# Patient Record
Sex: Male | Born: 1975 | Race: White | Hispanic: No | Marital: Married | Smoking: Current every day smoker
Health system: Southern US, Community
[De-identification: ages and names within clinical notes are randomized; demographics above are authoritative.]

## PROBLEM LIST (undated history)

## (undated) DIAGNOSIS — Z8709 Personal history of other diseases of the respiratory system: Secondary | ICD-10-CM

## (undated) DIAGNOSIS — I10 Essential (primary) hypertension: Secondary | ICD-10-CM

## (undated) DIAGNOSIS — Z720 Tobacco use: Secondary | ICD-10-CM

## (undated) DIAGNOSIS — M549 Dorsalgia, unspecified: Secondary | ICD-10-CM

## (undated) DIAGNOSIS — J40 Bronchitis, not specified as acute or chronic: Secondary | ICD-10-CM

## (undated) DIAGNOSIS — Z87442 Personal history of urinary calculi: Secondary | ICD-10-CM

## (undated) DIAGNOSIS — K219 Gastro-esophageal reflux disease without esophagitis: Secondary | ICD-10-CM

## (undated) DIAGNOSIS — R0602 Shortness of breath: Secondary | ICD-10-CM

## (undated) DIAGNOSIS — G8929 Other chronic pain: Secondary | ICD-10-CM

## (undated) HISTORY — PX: OTHER SURGICAL HISTORY: SHX169

---

## 2007-01-31 HISTORY — PX: APPENDECTOMY: SHX54

## 2012-06-04 ENCOUNTER — Other Ambulatory Visit: Payer: Self-pay | Admitting: Orthopedic Surgery

## 2012-06-10 ENCOUNTER — Encounter (HOSPITAL_COMMUNITY): Payer: Self-pay | Admitting: Pharmacy Technician

## 2012-06-13 ENCOUNTER — Encounter (HOSPITAL_COMMUNITY)
Admission: RE | Admit: 2012-06-13 | Discharge: 2012-06-13 | Disposition: A | Discharge status: Home / Self Care | Payer: Self-pay | Source: Ambulatory Visit | Attending: Orthopedic Surgery | Admitting: Orthopedic Surgery

## 2012-06-13 ENCOUNTER — Encounter (HOSPITAL_COMMUNITY): Payer: Self-pay

## 2012-06-13 ENCOUNTER — Ambulatory Visit (HOSPITAL_COMMUNITY)
Admission: RE | Admit: 2012-06-13 | Discharge: 2012-06-13 | Disposition: A | Discharge status: Home / Self Care | Payer: Self-pay | Source: Ambulatory Visit | Attending: Orthopedic Surgery | Admitting: Orthopedic Surgery

## 2012-06-13 DIAGNOSIS — Z01818 Encounter for other preprocedural examination: Secondary | ICD-10-CM | POA: Insufficient documentation

## 2012-06-13 DIAGNOSIS — Z01810 Encounter for preprocedural cardiovascular examination: Secondary | ICD-10-CM | POA: Insufficient documentation

## 2012-06-13 DIAGNOSIS — F17200 Nicotine dependence, unspecified, uncomplicated: Secondary | ICD-10-CM | POA: Insufficient documentation

## 2012-06-13 DIAGNOSIS — I10 Essential (primary) hypertension: Secondary | ICD-10-CM | POA: Insufficient documentation

## 2012-06-13 DIAGNOSIS — Z01812 Encounter for preprocedural laboratory examination: Secondary | ICD-10-CM | POA: Insufficient documentation

## 2012-06-13 HISTORY — DX: Dorsalgia, unspecified: M54.9

## 2012-06-13 HISTORY — DX: Other chronic pain: G89.29

## 2012-06-13 HISTORY — DX: Personal history of urinary calculi: Z87.442

## 2012-06-13 HISTORY — DX: Personal history of other diseases of the respiratory system: Z87.09

## 2012-06-13 HISTORY — DX: Essential (primary) hypertension: I10

## 2012-06-13 LAB — CBC WITH DIFFERENTIAL/PLATELET
Basophils Relative: 1 % (ref 0–1)
Eosinophils Absolute: 0.2 10*3/uL (ref 0.0–0.7)
HCT: 51.7 % (ref 39.0–52.0)
Hemoglobin: 18.4 g/dL — ABNORMAL HIGH (ref 13.0–17.0)
Lymphs Abs: 2.9 10*3/uL (ref 0.7–4.0)
MCH: 30.8 pg (ref 26.0–34.0)
MCHC: 35.6 g/dL (ref 30.0–36.0)
MCV: 86.6 fL (ref 78.0–100.0)
Monocytes Absolute: 0.6 10*3/uL (ref 0.1–1.0)
Monocytes Relative: 9 % (ref 3–12)
Neutrophils Relative %: 49 % (ref 43–77)
RBC: 5.97 MIL/uL — ABNORMAL HIGH (ref 4.22–5.81)

## 2012-06-13 LAB — URINALYSIS, ROUTINE W REFLEX MICROSCOPIC
Bilirubin Urine: NEGATIVE
Hgb urine dipstick: NEGATIVE
Ketones, ur: NEGATIVE mg/dL
Specific Gravity, Urine: 1.021 (ref 1.005–1.030)
pH: 5.5 (ref 5.0–8.0)

## 2012-06-13 LAB — COMPREHENSIVE METABOLIC PANEL
Albumin: 4.1 g/dL (ref 3.5–5.2)
Alkaline Phosphatase: 100 U/L (ref 39–117)
BUN: 10 mg/dL (ref 6–23)
Creatinine, Ser: 0.84 mg/dL (ref 0.50–1.35)
GFR calc Af Amer: >90 mL/min (ref >90)
Glucose, Bld: 116 mg/dL — ABNORMAL HIGH (ref 70–99)
Potassium: 3.8 mEq/L (ref 3.5–5.1)
Total Bilirubin: 0.6 mg/dL (ref 0.3–1.2)
Total Protein: 8.1 g/dL (ref 6.0–8.3)

## 2012-06-13 LAB — PROTIME-INR
INR: 0.96 (ref 0.00–1.49)
Prothrombin Time: 12.7 seconds (ref 11.6–15.2)

## 2012-06-13 LAB — TYPE AND SCREEN: Antibody Screen: NEGATIVE

## 2012-06-13 MED ORDER — POVIDONE-IODINE 7.5 % EX SOLN: Freq: Once | CUTANEOUS | Status: DC

## 2012-06-13 NOTE — Pre-Procedure Instructions (Signed)
Jose Chen  06/13/2012   Your procedure is scheduled on:  Thurs, May 22   Report to [facility] Please call Short Stay at 8 AM for arrival time  Call this number if you have problems the morning of surgery: [phone]   Remember:   Do not eat food or drink liquids after midnight.   Take these medicines the morning of surgery with A SIP OF WATER: Atenolol-Chlorthalidone(Tenoretic) and Pain Pill(if needed)   Do not wear jewelry  Do not wear lotions, powders, or colognes. You may wear deodorant.  Men may shave face and neck.  Do not bring valuables to the hospital.  Contacts, dentures or bridgework may not be worn into surgery.  Leave suitcase in the car. After surgery it may be brought to your room.  For patients admitted to the hospital, checkout time is 11:00 AM the day of  discharge.   Patients discharged the day of surgery will not be allowed to drive  home.    Special Instructions: Shower using CHG 2 nights before surgery and the night before surgery.  If you shower the day of surgery use CHG.  Use special wash - you have one bottle of CHG for all showers.  You should use approximately 1/3 of the bottle for each shower.   Please read over the following fact sheets that you were given: Pain Booklet, Coughing and Deep Breathing, Blood Transfusion Information, MRSA Information and Surgical Site Infection Prevention

## 2012-06-13 NOTE — Progress Notes (Signed)
PT DOESN'T HAVE A CARDIOLOGIST  STRESS TEST DONE 4+YRS AGO AT [facility] [place]  DENIES EVER HAVING AN ECHO OR HEART CATH  GOES TO A WALK IN CLINIC IN [facility]

## 2012-06-14 ENCOUNTER — Encounter (HOSPITAL_COMMUNITY): Payer: Self-pay | Admitting: Anesthesiology

## 2012-06-14 NOTE — Progress Notes (Signed)
This is a 37 year old male who is scheduled for a decompressive lumbar laminectomy to be performed by Dr. Estill Bamberg on Thursday 20 Jun 2012. His pre-anesthesia labs dated 13 Jun 2012 revealed some abnormally elevated results to include: RBC-5.97(4.22-5.81), HGB-18.4(13.0-17.0), AST-177(0-37), ALT-352(0-53). Apparently he is seen in a walk-in clinic in [place]. I attempted to contact this patient, I left a message but received no response, as I wanted to forward these abnormal lab results to his PCP fur further evaluation. I contacted Dr. Marshell Levan office and advised them of these abnormal lab values and faxed the lab results their office at [phone]. This patient needs a GI evaluation to determine the cause of his elevated liver transaminases. This should not interfere with his surgery.

## 2012-06-19 MED ORDER — CEFAZOLIN SODIUM-DEXTROSE 2-3 GM-% IV SOLR: 2.0000 g | INTRAVENOUS | Status: DC

## 2012-06-19 NOTE — Progress Notes (Signed)
Pt. Notified of arrival time (11:30 AM)for surgery at 1350.Pt. Voices understnding.

## 2012-06-20 ENCOUNTER — Encounter (HOSPITAL_COMMUNITY): Admission: RE | Payer: Self-pay | Source: Ambulatory Visit

## 2012-06-20 ENCOUNTER — Ambulatory Visit (HOSPITAL_COMMUNITY)
Admission: RE | Admit: 2012-06-20 | Payer: Worker's Compensation | Source: Ambulatory Visit | Admitting: Orthopedic Surgery

## 2012-06-20 SURGERY — LUMBAR LAMINECTOMY/DECOMPRESSION MICRODISCECTOMY
Anesthesia: General

## 2012-08-06 ENCOUNTER — Other Ambulatory Visit: Payer: Self-pay | Admitting: Orthopedic Surgery

## 2012-08-06 ENCOUNTER — Encounter (HOSPITAL_COMMUNITY): Payer: Self-pay | Admitting: Pharmacy Technician

## 2012-08-09 ENCOUNTER — Encounter (HOSPITAL_COMMUNITY): Payer: Self-pay | Admitting: Vascular Surgery

## 2012-08-12 NOTE — Progress Notes (Signed)
Anesthesia follow-up:  Patient is a 37 year old male with history of smoking and who is in need of a lumbar laminectomy/microdiscectomy.  Case is workman's comp.  Surgery was postponed from 06/14/12 due to abnormal LFTs (AST 177, ALT 352).  He was seen by his PCP Mauricio Po, FNP on 06/18/12.  He underwent an abdominal ultrasound on 07/02/12 that showed mild fatty infiltration of the liver, mild dilation of the common bile duct 4.4 mm.  Repeat AST/ALT were 142/293.  Hepatitis B Core Ab and Hepatitis A and C antibodies were negative. His PCP recommended further evaluation, but he has not been back for follow-up. He does have a history of daily ETOH use.  Meds include Norco, ASA, atenolol/chlorthalidone. Carla at Dr. Marshell Levan office called regarding potentially rescheduling him for 06/15/12.  I felt that because his work-up was incomplete he would need further input from his PCP.  Albin Felling spoke with [place], NP who reportedly felt he would need a HIDA scan and potentially general surgery evaluation based on results.  She would not clear him at this point because he had not been back in to see her for follow-up and work-up for elevated LFT's was still incomplete.  [facility] Short Stay Center/Anesthesiology Phone [phone] 08/12/2012 12:45 PM

## 2012-08-13 ENCOUNTER — Encounter (HOSPITAL_COMMUNITY)
Admission: RE | Admit: 2012-08-13 | Discharge: 2012-08-13 | Disposition: A | Discharge status: Home / Self Care | Payer: Self-pay | Source: Ambulatory Visit | Attending: Orthopedic Surgery | Admitting: Orthopedic Surgery

## 2012-08-15 ENCOUNTER — Ambulatory Visit (HOSPITAL_COMMUNITY): Admission: RE | Admit: 2012-08-15 | Payer: Self-pay | Source: Ambulatory Visit | Admitting: Orthopedic Surgery

## 2012-08-15 ENCOUNTER — Encounter (HOSPITAL_COMMUNITY): Admission: RE | Payer: Self-pay | Source: Ambulatory Visit

## 2012-08-15 SURGERY — LUMBAR LAMINECTOMY/DECOMPRESSION MICRODISCECTOMY
Anesthesia: General | Laterality: Right

## 2012-10-22 ENCOUNTER — Other Ambulatory Visit: Payer: Self-pay | Admitting: Orthopedic Surgery

## 2012-11-01 ENCOUNTER — Encounter (HOSPITAL_COMMUNITY): Payer: Self-pay | Admitting: Pharmacy Technician

## 2012-11-01 ENCOUNTER — Encounter (HOSPITAL_COMMUNITY): Payer: Self-pay

## 2012-11-01 ENCOUNTER — Encounter (HOSPITAL_COMMUNITY)
Admission: RE | Admit: 2012-11-01 | Discharge: 2012-11-01 | Disposition: A | Discharge status: Home / Self Care | Payer: Worker's Compensation | Source: Ambulatory Visit | Attending: Orthopedic Surgery | Admitting: Orthopedic Surgery

## 2012-11-01 ENCOUNTER — Other Ambulatory Visit (HOSPITAL_COMMUNITY): Payer: Self-pay | Admitting: *Deleted

## 2012-11-01 DIAGNOSIS — Z01818 Encounter for other preprocedural examination: Secondary | ICD-10-CM | POA: Insufficient documentation

## 2012-11-01 DIAGNOSIS — Z01812 Encounter for preprocedural laboratory examination: Secondary | ICD-10-CM | POA: Insufficient documentation

## 2012-11-01 HISTORY — DX: Gastro-esophageal reflux disease without esophagitis: K21.9

## 2012-11-01 HISTORY — DX: Shortness of breath: R06.02

## 2012-11-01 HISTORY — DX: Bronchitis, not specified as acute or chronic: J40

## 2012-11-01 HISTORY — DX: Tobacco use: Z72.0

## 2012-11-01 LAB — URINALYSIS, ROUTINE W REFLEX MICROSCOPIC
Bilirubin Urine: NEGATIVE
Hgb urine dipstick: NEGATIVE
Leukocytes, UA: NEGATIVE
Nitrite: NEGATIVE
Specific Gravity, Urine: 1.022 (ref 1.005–1.030)
Urobilinogen, UA: 1 mg/dL (ref 0.0–1.0)

## 2012-11-01 LAB — COMPREHENSIVE METABOLIC PANEL
ALT: 241 U/L — ABNORMAL HIGH (ref 0–53)
AST: 120 U/L — ABNORMAL HIGH (ref 0–37)
Albumin: 4.2 g/dL (ref 3.5–5.2)
Alkaline Phosphatase: 82 U/L (ref 39–117)
BUN: 10 mg/dL (ref 6–23)
Calcium: 9.9 mg/dL (ref 8.4–10.5)
Chloride: 100 mEq/L (ref 96–112)
Potassium: 4 mEq/L (ref 3.5–5.1)
Sodium: 137 mEq/L (ref 135–145)
Total Bilirubin: 0.5 mg/dL (ref 0.3–1.2)
Total Protein: 7.7 g/dL (ref 6.0–8.3)

## 2012-11-01 LAB — CBC WITH DIFFERENTIAL/PLATELET
Basophils Absolute: 0.1 10*3/uL (ref 0.0–0.1)
Basophils Relative: 1 % (ref 0–1)
Eosinophils Absolute: 0.2 10*3/uL (ref 0.0–0.7)
Eosinophils Relative: 3 % (ref 0–5)
HCT: 50.8 % (ref 39.0–52.0)
Hemoglobin: 17.3 g/dL — ABNORMAL HIGH (ref 13.0–17.0)
MCH: 30.3 pg (ref 26.0–34.0)
MCHC: 34.1 g/dL (ref 30.0–36.0)
Monocytes Relative: 10 % (ref 3–12)
Neutro Abs: 3.7 10*3/uL (ref 1.7–7.7)
Neutrophils Relative %: 47 % (ref 43–77)
Platelets: 165 10*3/uL (ref 150–400)

## 2012-11-01 LAB — PROTIME-INR: Prothrombin Time: 13.2 seconds (ref 11.6–15.2)

## 2012-11-01 LAB — TYPE AND SCREEN: Antibody Screen: NEGATIVE

## 2012-11-01 LAB — APTT: aPTT: 27 seconds (ref 24–37)

## 2012-11-01 NOTE — Pre-Procedure Instructions (Signed)
Jose Chen  11/01/2012   Your procedure is scheduled on:  Wednesday, November 06, 2012 at 2:00 PM.   Report to [facility] Main Entrance "A" at 11:00 AM.   Call this number if you have problems the morning of surgery: [phone]   Remember:   Do not eat food or drink liquids after midnight Tuesday, 11/05/12.   Take these medicines the morning of surgery with A SIP OF WATER: atenolol-chlorthalidone (TENORETIC), HYDROcodone-acetaminophen (NORCO) - if needed  Stop all Vitamins, Herbal Medications, Aspirin as of today, 11/01/12.   Do not wear jewelry.  Do not wear lotions, powders, or cologne. You may wear deodorant.  Do not shave 48 hours prior to surgery. Men may shave face and neck.  Do not bring valuables to the hospital.  [facility] is not responsible                  for any belongings or valuables.               Contacts, dentures or bridgework may not be worn into surgery.  Leave suitcase in the car. After surgery it may be brought to your room.  For patients admitted to the hospital, discharge time is determined by your                treatment team.                 Special Instructions: Shower using CHG 2 nights before surgery and the night before surgery.  If you shower the day of surgery use CHG.  Use special wash - you have one bottle of CHG for all showers.  You should use approximately 1/3 of the bottle for each shower.   Please read over the following fact sheets that you were given: Pain Booklet, Coughing and Deep Breathing, MRSA Information and Surgical Site Infection Prevention

## 2012-11-01 NOTE — Progress Notes (Signed)
Primary physician - dr. Mauricio Po Does not have a cardiologist No cardiac testing. ekg in may in epic

## 2012-11-04 ENCOUNTER — Encounter (HOSPITAL_COMMUNITY): Payer: Self-pay

## 2012-11-04 NOTE — Progress Notes (Signed)
Anesthesia Chart Review:  Patient is a 37 year old male scheduled for L4-5 decompression on 11/06/12 by Dr. Yevette Edwards.  He was previously scheduled in May and then July 2014, but was postponed until he could be further evaluated by his PCP Mauricio Po, NP at [facility] for elevated LFTs (AST 177/ALT 352 in 05/2012).  Since then he was re-evaluated by Mauricio Po, NP and cleared for surgery following basic work-up for elevated liver enzymes.  She felt further GI work-up could be done post-operatively.  She did want the surgeon to consider post-operative hospitalist consult.  (See office note from 10/04/12 and clearance note dated 10/15/12.)  Other history includes smoking, obesity, HTN, nephrolithiasis, GERD, appendectomy, daily ETOH use (6 beers).  EKG on 06/13/13 showed NSR.  CXR on 06/13/13 showed no active disease.  He underwent an abdominal ultrasound on 07/02/12 that showed mild fatty infiltration of the liver, mild dilation of the common bile duct 4.4 mm.  Per PCP notes, he had a normal abdominal CT scan.  Preoperative labs noted.  AST 120, ALT 241 (stable, slightly decreased from 06/13/12).  PLT count and coags WNL. Hepatitis B Core Ab and Hepatitis A and C antibodies were negative on 06/18/12 (PCP).  I reviewed above with anesthesiologist Dr. Michelle Piper.  LFT's remain stable.  Other labs unremarkable, and recent infectious hepatitis panel was negative.  If no acute changes then anticipate that he can proceed as planned with out-patient GI follow-up as arranged by his PCP.  [facility] Short Stay Center/Anesthesiology Phone [phone] 11/04/2012 3:54 PM

## 2012-11-05 MED ORDER — CEFAZOLIN SODIUM-DEXTROSE 2-3 GM-% IV SOLR
2.0000 g | INTRAVENOUS | Status: AC
  Administered 2012-11-06: 2 g via INTRAVENOUS
  Filled 2012-11-05: qty 50

## 2012-11-05 MED ORDER — POVIDONE-IODINE 7.5 % EX SOLN
Freq: Once | CUTANEOUS | Status: DC
  Filled 2012-11-05: qty 118

## 2012-11-06 ENCOUNTER — Encounter (HOSPITAL_COMMUNITY): Payer: Worker's Compensation | Admitting: Vascular Surgery

## 2012-11-06 ENCOUNTER — Encounter (HOSPITAL_COMMUNITY)
Admission: RE | Disposition: A | Discharge status: Home / Self Care | Payer: Self-pay | Source: Ambulatory Visit | Attending: Orthopedic Surgery

## 2012-11-06 ENCOUNTER — Ambulatory Visit (HOSPITAL_COMMUNITY): Payer: Worker's Compensation | Admitting: Certified Registered"

## 2012-11-06 ENCOUNTER — Ambulatory Visit (HOSPITAL_COMMUNITY): Payer: Worker's Compensation

## 2012-11-06 ENCOUNTER — Ambulatory Visit (HOSPITAL_COMMUNITY)
Admission: RE | Admit: 2012-11-06 | Discharge: 2012-11-06 | Disposition: A | Discharge status: Home / Self Care | Payer: Worker's Compensation | Source: Ambulatory Visit | Attending: Orthopedic Surgery | Admitting: Orthopedic Surgery

## 2012-11-06 ENCOUNTER — Encounter (HOSPITAL_COMMUNITY): Payer: Self-pay | Admitting: Certified Registered"

## 2012-11-06 DIAGNOSIS — F17200 Nicotine dependence, unspecified, uncomplicated: Secondary | ICD-10-CM | POA: Insufficient documentation

## 2012-11-06 DIAGNOSIS — K219 Gastro-esophageal reflux disease without esophagitis: Secondary | ICD-10-CM | POA: Insufficient documentation

## 2012-11-06 DIAGNOSIS — I10 Essential (primary) hypertension: Secondary | ICD-10-CM | POA: Insufficient documentation

## 2012-11-06 DIAGNOSIS — M549 Dorsalgia, unspecified: Secondary | ICD-10-CM | POA: Insufficient documentation

## 2012-11-06 DIAGNOSIS — M5126 Other intervertebral disc displacement, lumbar region: Secondary | ICD-10-CM | POA: Insufficient documentation

## 2012-11-06 DIAGNOSIS — N200 Calculus of kidney: Secondary | ICD-10-CM | POA: Insufficient documentation

## 2012-11-06 DIAGNOSIS — G8929 Other chronic pain: Secondary | ICD-10-CM | POA: Insufficient documentation

## 2012-11-06 HISTORY — PX: LUMBAR LAMINECTOMY/DECOMPRESSION MICRODISCECTOMY: SHX5026

## 2012-11-06 SURGERY — LUMBAR LAMINECTOMY/DECOMPRESSION MICRODISCECTOMY
Anesthesia: General | Site: Spine Lumbar | Wound class: Clean

## 2012-11-06 MED ORDER — THROMBIN 20000 UNITS EX SOLR
CUTANEOUS | Status: DC | PRN
  Administered 2012-11-06: 14:00:00 via TOPICAL

## 2012-11-06 MED ORDER — MIDAZOLAM HCL 5 MG/5ML IJ SOLN
INTRAMUSCULAR | Status: DC | PRN
  Administered 2012-11-06: 2 mg via INTRAVENOUS

## 2012-11-06 MED ORDER — BUPIVACAINE-EPINEPHRINE 0.25% -1:200000 IJ SOLN
INTRAMUSCULAR | Status: DC | PRN
  Administered 2012-11-06: 7 mL

## 2012-11-06 MED ORDER — METHYLPREDNISOLONE ACETATE 40 MG/ML IJ SUSP
INTRAMUSCULAR | Status: AC
  Filled 2012-11-06: qty 1

## 2012-11-06 MED ORDER — EPHEDRINE SULFATE 50 MG/ML IJ SOLN
INTRAMUSCULAR | Status: DC | PRN
  Administered 2012-11-06 (×5): 10 mg via INTRAVENOUS

## 2012-11-06 MED ORDER — DIPHENHYDRAMINE HCL 50 MG/ML IJ SOLN
INTRAMUSCULAR | Status: AC
  Filled 2012-11-06: qty 1

## 2012-11-06 MED ORDER — ONDANSETRON HCL 4 MG/2ML IJ SOLN: 4.0000 mg | Freq: Once | INTRAMUSCULAR | Status: DC | PRN

## 2012-11-06 MED ORDER — PROPOFOL 10 MG/ML IV BOLUS
INTRAVENOUS | Status: DC | PRN
  Administered 2012-11-06: 350 mg via INTRAVENOUS

## 2012-11-06 MED ORDER — HEMOSTATIC AGENTS (NO CHARGE) OPTIME
TOPICAL | Status: DC | PRN
  Administered 2012-11-06: 1 via TOPICAL

## 2012-11-06 MED ORDER — DIPHENHYDRAMINE HCL 50 MG/ML IJ SOLN
12.5000 mg | Freq: Once | INTRAMUSCULAR | Status: AC
  Administered 2012-11-06: 12.5 mg via INTRAVENOUS

## 2012-11-06 MED ORDER — ONDANSETRON HCL 4 MG/2ML IJ SOLN
INTRAMUSCULAR | Status: DC | PRN
  Administered 2012-11-06: 4 mg via INTRAMUSCULAR

## 2012-11-06 MED ORDER — METHYLPREDNISOLONE ACETATE 40 MG/ML IJ SUSP
INTRAMUSCULAR | Status: DC | PRN
  Administered 2012-11-06: 40 mg via INTRA_ARTICULAR

## 2012-11-06 MED ORDER — METHYLENE BLUE 1 % INJ SOLN
INTRAMUSCULAR | Status: DC | PRN
  Administered 2012-11-06: .25 mL

## 2012-11-06 MED ORDER — ROCURONIUM BROMIDE 100 MG/10ML IV SOLN
INTRAVENOUS | Status: DC | PRN
  Administered 2012-11-06: 50 mg via INTRAVENOUS

## 2012-11-06 MED ORDER — HYDROMORPHONE HCL PF 1 MG/ML IJ SOLN
INTRAMUSCULAR | Status: AC
  Filled 2012-11-06: qty 1

## 2012-11-06 MED ORDER — LIDOCAINE HCL (CARDIAC) 20 MG/ML IV SOLN
INTRAVENOUS | Status: DC | PRN
  Administered 2012-11-06 (×2): 100 mg via INTRAVENOUS

## 2012-11-06 MED ORDER — 0.9 % SODIUM CHLORIDE (POUR BTL) OPTIME
TOPICAL | Status: DC | PRN
  Administered 2012-11-06: 1000 mL

## 2012-11-06 MED ORDER — LACTATED RINGERS IV SOLN
INTRAVENOUS | Status: DC
  Administered 2012-11-06 (×2): via INTRAVENOUS

## 2012-11-06 MED ORDER — THROMBIN 20000 UNITS EX SOLR
CUTANEOUS | Status: AC
  Filled 2012-11-06: qty 20000

## 2012-11-06 MED ORDER — INDIGOTINDISULFONATE SODIUM 8 MG/ML IJ SOLN: INTRAMUSCULAR | Status: DC | PRN

## 2012-11-06 MED ORDER — HYDROMORPHONE HCL PF 1 MG/ML IJ SOLN
0.2500 mg | INTRAMUSCULAR | Status: DC | PRN
  Administered 2012-11-06 (×2): 0.5 mg via INTRAVENOUS

## 2012-11-06 MED ORDER — SUFENTANIL CITRATE 50 MCG/ML IV SOLN
INTRAVENOUS | Status: DC | PRN
  Administered 2012-11-06 (×2): 5 ug via INTRAVENOUS
  Administered 2012-11-06: 30 ug via INTRAVENOUS

## 2012-11-06 SURGICAL SUPPLY — 66 items
BENZOIN TINCTURE PRP APPL 2/3 (GAUZE/BANDAGES/DRESSINGS) IMPLANT
BUR ROUND PRECISION 4.0 (BURR) ×4 IMPLANT
CANISTER SUCTION 2500CC (MISCELLANEOUS) ×2 IMPLANT
CARTRIDGE OIL MAESTRO DRILL (MISCELLANEOUS) ×1 IMPLANT
CLOTH BEACON ORANGE TIMEOUT ST (SAFETY) ×2 IMPLANT
CORDS BIPOLAR (ELECTRODE) ×2 IMPLANT
COVER SURGICAL LIGHT HANDLE (MISCELLANEOUS) ×2 IMPLANT
DIFFUSER DRILL AIR PNEUMATIC (MISCELLANEOUS) ×2 IMPLANT
DRAIN CHANNEL 15F RND FF W/TCR (WOUND CARE) IMPLANT
DRAPE POUCH INSTRU U-SHP 10X18 (DRAPES) ×4 IMPLANT
DRAPE SURG 17X23 STRL (DRAPES) ×8 IMPLANT
DURAPREP 26ML APPLICATOR (WOUND CARE) ×2 IMPLANT
ELECT BLADE 4.0 EZ CLEAN MEGAD (MISCELLANEOUS)
ELECT CAUTERY BLADE 6.4 (BLADE) ×2 IMPLANT
ELECT REM PT RETURN 9FT ADLT (ELECTROSURGICAL) ×2
ELECTRODE BLDE 4.0 EZ CLN MEGD (MISCELLANEOUS) IMPLANT
ELECTRODE REM PT RTRN 9FT ADLT (ELECTROSURGICAL) ×1 IMPLANT
EVACUATOR SILICONE 100CC (DRAIN) IMPLANT
FILTER STRAW FLUID ASPIR (MISCELLANEOUS) ×2 IMPLANT
GAUZE SPONGE 4X4 16PLY XRAY LF (GAUZE/BANDAGES/DRESSINGS) ×4 IMPLANT
GLOVE BIO SURGEON STRL SZ7 (GLOVE) ×2 IMPLANT
GLOVE BIO SURGEON STRL SZ8 (GLOVE) ×2 IMPLANT
GLOVE BIOGEL PI IND STRL 7.0 (GLOVE) ×1 IMPLANT
GLOVE BIOGEL PI IND STRL 8 (GLOVE) ×1 IMPLANT
GLOVE BIOGEL PI INDICATOR 7.0 (GLOVE) ×1
GLOVE BIOGEL PI INDICATOR 8 (GLOVE) ×1
GOWN STRL NON-REIN LRG LVL3 (GOWN DISPOSABLE) ×2 IMPLANT
GOWN STRL REIN XL XLG (GOWN DISPOSABLE) ×4 IMPLANT
IV CATH 14GX2 1/4 (CATHETERS) ×2 IMPLANT
KIT BASIN OR (CUSTOM PROCEDURE TRAY) ×2 IMPLANT
KIT POSITION SURG JACKSON T1 (MISCELLANEOUS) IMPLANT
KIT ROOM TURNOVER OR (KITS) ×2 IMPLANT
NEEDLE 18GX1X1/2 (RX/OR ONLY) (NEEDLE) ×2 IMPLANT
NEEDLE 22X1 1/2 (OR ONLY) (NEEDLE) ×2 IMPLANT
NEEDLE HYPO 25GX1X1/2 BEV (NEEDLE) ×2 IMPLANT
NEEDLE SPNL 18GX3.5 QUINCKE PK (NEEDLE) ×4 IMPLANT
NS IRRIG 1000ML POUR BTL (IV SOLUTION) ×2 IMPLANT
OIL CARTRIDGE MAESTRO DRILL (MISCELLANEOUS) ×2
PACK LAMINECTOMY ORTHO (CUSTOM PROCEDURE TRAY) ×2 IMPLANT
PACK UNIVERSAL I (CUSTOM PROCEDURE TRAY) ×2 IMPLANT
PAD ARMBOARD 7.5X6 YLW CONV (MISCELLANEOUS) ×4 IMPLANT
PATTIES SURGICAL .5 X.5 (GAUZE/BANDAGES/DRESSINGS) IMPLANT
PATTIES SURGICAL .5 X1 (DISPOSABLE) ×2 IMPLANT
SPONGE GAUZE 4X4 12PLY (GAUZE/BANDAGES/DRESSINGS) ×2 IMPLANT
SPONGE INTESTINAL PEANUT (DISPOSABLE) ×2 IMPLANT
SPONGE SURGIFOAM ABS GEL 100 (HEMOSTASIS) ×2 IMPLANT
STRIP CLOSURE SKIN 1/2X4 (GAUZE/BANDAGES/DRESSINGS) IMPLANT
SURGIFLO TRUKIT (HEMOSTASIS) IMPLANT
SUT MNCRL AB 4-0 PS2 18 (SUTURE) ×2 IMPLANT
SUT VIC AB 0 CT1 18XCR BRD 8 (SUTURE) ×1 IMPLANT
SUT VIC AB 0 CT1 27 (SUTURE)
SUT VIC AB 0 CT1 27XBRD ANBCTR (SUTURE) IMPLANT
SUT VIC AB 0 CT1 8-18 (SUTURE) ×1
SUT VIC AB 1 CT1 18XCR BRD 8 (SUTURE) ×1 IMPLANT
SUT VIC AB 1 CT1 8-18 (SUTURE) ×1
SUT VIC AB 2-0 CT2 18 VCP726D (SUTURE) ×2 IMPLANT
SYR 20CC LL (SYRINGE) IMPLANT
SYR BULB IRRIGATION 50ML (SYRINGE) ×2 IMPLANT
SYR CONTROL 10ML LL (SYRINGE) ×4 IMPLANT
SYR TB 1ML 26GX3/8 SAFETY (SYRINGE) ×4 IMPLANT
SYR TB 1ML LUER SLIP (SYRINGE) ×4 IMPLANT
TAPE CLOTH SURG 4X10 WHT LF (GAUZE/BANDAGES/DRESSINGS) ×2 IMPLANT
TOWEL OR 17X24 6PK STRL BLUE (TOWEL DISPOSABLE) ×2 IMPLANT
TOWEL OR 17X26 10 PK STRL BLUE (TOWEL DISPOSABLE) ×2 IMPLANT
WATER STERILE IRR 1000ML POUR (IV SOLUTION) ×2 IMPLANT
YANKAUER SUCT BULB TIP NO VENT (SUCTIONS) ×2 IMPLANT

## 2012-11-06 NOTE — Anesthesia Preprocedure Evaluation (Addendum)
Anesthesia Evaluation  Patient identified by MRN, date of birth, ID band Patient awake    Reviewed: Allergy & Precautions, H&P , NPO status , Patient's Chart, lab work & pertinent test results  Airway Mallampati: I TM Distance: >3 FB Neck ROM: full    Dental   Pulmonary Current Smoker,          Cardiovascular hypertension, Rhythm:regular Rate:Normal     Neuro/Psych    GI/Hepatic GERD-  ,  Endo/Other    Renal/GU      Musculoskeletal   Abdominal   Peds  Hematology   Anesthesia Other Findings   Reproductive/Obstetrics                          Anesthesia Physical Anesthesia Plan  ASA: I  Anesthesia Plan: General   Post-op Pain Management:    Induction: Intravenous  Airway Management Planned: Oral ETT  Additional Equipment:   Intra-op Plan:   Post-operative Plan: Extubation in OR  Informed Consent: I have reviewed the patients History and Physical, chart, labs and discussed the procedure including the risks, benefits and alternatives for the proposed anesthesia with the patient or authorized representative who has indicated his/her understanding and acceptance.     Plan Discussed with: CRNA, Anesthesiologist and Surgeon  Anesthesia Plan Comments:         Anesthesia Quick Evaluation

## 2012-11-06 NOTE — Preoperative (Signed)
Beta Blockers   Reason not to administer Beta Blockers:Atenolol taken at 0745 hrs on 11/06/12

## 2012-11-06 NOTE — Transfer of Care (Signed)
Immediate Anesthesia Transfer of Care Note  Patient: Jose Chen  Procedure(s) Performed: Procedure(s) with comments: LUMBAR LAMINECTOMY/DECOMPRESSION MICRODISCECTOMY (N/A) - Lumbar 4-5 decompression  Patient Location: PACU  Anesthesia Type:General  Level of Consciousness: awake, alert  and oriented  Airway & Oxygen Therapy: Patient Spontanous Breathing and Patient connected to nasal cannula oxygen  Post-op Assessment: Report given to PACU RN, Post -op Vital signs reviewed and stable and Patient moving all extremities  Post vital signs: Reviewed and stable  Complications: No apparent anesthesia complications

## 2012-11-06 NOTE — Anesthesia Procedure Notes (Signed)
Procedure Name: Intubation Date/Time: 11/06/2012 1:03 PM Performed by: Charm Barges, Teneka Malmberg R Pre-anesthesia Checklist: Patient identified, Emergency Drugs available, Suction available, Patient being monitored and Timeout performed Patient Re-evaluated:Patient Re-evaluated prior to inductionOxygen Delivery Method: Circle system utilized Preoxygenation: Pre-oxygenation with 100% oxygen Intubation Type: IV induction Ventilation: Mask ventilation with difficulty and Oral airway inserted - appropriate to patient size Laryngoscope Size: Mac and 4 Grade View: Grade II Tube type: Oral Tube size: 8.0 mm Number of attempts: 2 (1st attempt trachea sprayed with 2% Lidocaine) Airway Equipment and Method: Stylet Secured at: 22 cm Tube secured with: Tape Dental Injury: Teeth and Oropharynx as per pre-operative assessment

## 2012-11-06 NOTE — H&P (Signed)
PREOPERATIVE H&P  Chief Complaint: Ongoing right leg pain  HPI: Jose Chen is a 37 y.o. male who presents with ongoing right leg pain. Patient has failed multiple forms of conservative care and continues to have pain. MRI = HNP L4/5.   Past Medical History  Diagnosis Date  . Hypertension     TAKES TENORETIC DAILY  . History of bronchitis     > 66YRS AGO  . Chronic back pain   . History of kidney stones     STILL THERE BUT NOT HAVING ANY TROUBLE FROM THIS  . Shortness of breath     smoking exacerbates  . Bronchitis due to tobacco use   . GERD (gastroesophageal reflux disease)    Past Surgical History  Procedure Laterality Date  . Cyst removed      BUT PT WISHES NOT TO SAY WHERE REMOVED FROM   . Appendectomy  2009   History   Social History  . Marital Status: Single    Spouse Name: N/A    Number of Children: N/A  . Years of Education: N/A   Social History Main Topics  . Smoking status: Current Every Day Smoker -- 1.00 packs/day for 21 years  . Smokeless tobacco: Not on file  . Alcohol Use: 3.6 oz/week    6 Cans of beer per week     Comment: DAILY BEER(6)  . Drug Use: No  . Sexual Activity: Not Currently   Other Topics Concern  . Not on file   Social History Narrative  . No narrative on file   No family history on file. No Known Allergies Prior to Admission medications   Medication Sig Start Date End Date Taking? Authorizing Provider  atenolol-chlorthalidone (TENORETIC) 100-25 MG per tablet Take 1 tablet by mouth daily.   Yes Historical Provider, MD  gabapentin (NEURONTIN) 300 MG capsule Take 300 mg by mouth 3 (three) times daily as needed (pain).    Yes Historical Provider, MD  HYDROcodone-acetaminophen (NORCO) 10-325 MG per tablet Take 1 tablet by mouth 4 (four) times daily as needed for pain.    Yes Historical Provider, MD     All other systems have been reviewed and were otherwise negative with the exception of those mentioned in the HPI and as  above.  Physical Exam: There were no vitals filed for this visit.  General: Alert, no acute distress Cardiovascular: No pedal edema Respiratory: No cyanosis, no use of accessory musculature Skin: No lesions in the area of chief complaint Neurologic: Sensation intact distally Psychiatric: Patient is competent for consent with normal mood and affect Lymphatic: No axillary or cervical lymphadenopathy  MUSCULOSKELETAL: + SLR ON RIGHT  Assessment/Plan: Right leg pain Plan for Procedure(s): LUMBAR LAMINECTOMY L4/5   Emilee Hero, MD 11/06/2012 8:14 AM

## 2012-11-07 NOTE — Progress Notes (Signed)
Saw blood on the bed in preop

## 2012-11-07 NOTE — Op Note (Signed)
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  PROCEDURE:  L4-5 decompression, bilateral partial facetectomy, and right- sided foraminotomy with removal of large extruded and adherent foraminal and extra foraminal right-sided L4-5 disk herniation.  SURGEON:  Estill Bamberg, MD  ASSISTANT:  Jason Coop, [facility]  ANESTHESIA:  General endotracheal anesthesia.  COMPLICATIONS:  None.  DISPOSITION:  Stable.  ESTIMATED BLOOD LOSS:  Minimal.  INDICATIONS FOR PROCEDURE:  Briefly, Jose Chen is a very pleasant 37- year-old male who did initially present to me on May 27, 2012, with severe pain in the right leg.  The patient is status post a work injury that did occur on February 26, 2012, when he was working at a [employer].  The patient went on to have severe pain in the right leg and in the distribution of the L4 nerve on the right side.  I did review a MRI, which was clearly notable for a right-sided L4-5 disk herniation spanning the lateral recess on the right, in addition to the foramen and extraforaminal area.  We did go forward with multiple old conservative treatment measures.  The patient, however, did continue to have ongoing pain.  We therefore did discuss proceeding with a decompressive procedure to remove the herniated disk fragment.  Of note, the  patient did have a workup of his abdomen, as per his nurse practitioner, Mauricio Po.  It was ultimately felt the patient would be safe to proceed with surgery.  In addition, workup is still pending as of this morning, per the patient.  Also noted, I did explain to the patient that the disk fragment was located in the foraminal and extraforaminal area.  I therefore going to let him know that this tend to be more meticulous surgery, as it did span the lateral recess, the foraminal region and did span into the extraforaminal region as well.  The amount of bone that was required to remove the herniated fragment may result in instability in addition other issues.  I did explain this to him in detail preoperatively.  He did also understand the risk of lack of resolution of his pain.  In additional, the disk fragments were herniating into the extraforaminal region, which could potentially be an accessible.  If this were to occur, he did understand that this may result in the need for additional procedures.  The patient did fully understand this and wish to proceed.  OPERATIVE DETAILS:  On November 06, 2012, the patient brought to surgery and general endotracheal anesthesia was administered.  The patient was placed prone on a well-padded flat Jackson bed with a Wilson frame. Antibiotics were given and  a time-out procedure was performed.  I then made a midline incision overlying the L4-5 interspace.  I did use a high- speed bur to preform in order to remove the medial and superior aspect of the L4 lamina.  I then performed a partial facetectomy bilaterally. The spinous process of L4 was removed.  This did allow me the ability to gain access to the mixture foraminal area on the right side.  It was readily obvious that there was a significant and a large protruding L4-5 disk fragment and located the extraforaminal area.  I was able to locate the exiting L4 nerve, it was obvious that the nerve was  under substantial pressure from the herniated fragment.  I was able to develop a plane between the herniated fragment and the exiting L4 nerve.  I then proceeded with removing multiple fragments of disk located in the foraminal and extraforaminal region.  Of note, there were multiple fragments involved.  There were multiple fragments that were also very much adherent to the L4 vertebral body.  I did remove all fragments that was accessible.  I then took a Public house manager and was able to liberally sweep out along the course of the L4 nerve.  I did note excellent decompression of the L4 nerve in the region and I was able to palpate.  I did feel that adequate decompression was performed.  I then copiously irrigated the wound.  I then controlled epidural bleeding using Surgiflo in addition to bipolar electrocautery.  I then introduced 40 mg of Depo-Medrol about the epidural space.  I then closed the fascia using #1 Vicryl.  The subcutaneous layer was closed using 2-0 Vicryl and the skin was closed using 3-0 Monocryl.  Benzoin and Steri-Strips were applied followed by sterile dressing.  All instrument counts were correct at the termination of the procedure.  Of note, Jason Coop was my assistant throughout the entirety of the procedure, and did aid in essential retraction and suctioning needed throughout the entirety of the surgery.     Estill Bamberg, MD     MD/MEDQ  D:  11/06/2012  T:  11/07/2012  Job:  [id number]

## 2012-11-07 NOTE — Progress Notes (Signed)
Also there were previous patients labels on the bed.

## 2012-11-07 NOTE — Anesthesia Postprocedure Evaluation (Signed)
Anesthesia Post Note  Patient: Jose Chen  Procedure(s) Performed: Procedure(s) (LRB): LUMBAR LAMINECTOMY/DECOMPRESSION MICRODISCECTOMY (N/A)  Anesthesia type: General  Patient location: PACU  Post pain: Pain level controlled  Post assessment: Patient's Cardiovascular Status Stable  Last Vitals:  Filed Vitals:   11/06/12 1730  BP:   Pulse:   Temp: 36.7 C  Resp:     Post vital signs: Reviewed and stable  Level of consciousness: alert  Complications: No apparent anesthesia complications

## 2012-11-08 ENCOUNTER — Encounter (HOSPITAL_COMMUNITY): Payer: Self-pay | Admitting: Orthopedic Surgery

## 2014-03-31 DEATH — deceased

## 2014-06-06 IMAGING — CR DG CHEST 2V
2 series · 2 of 2 positions shown · non-contrast
Comparison: 09/19/2009

CLINICAL DATA: Preop for lumbar decompression

CHEST - 2 VIEW

[view not recorded (1 of 2)]
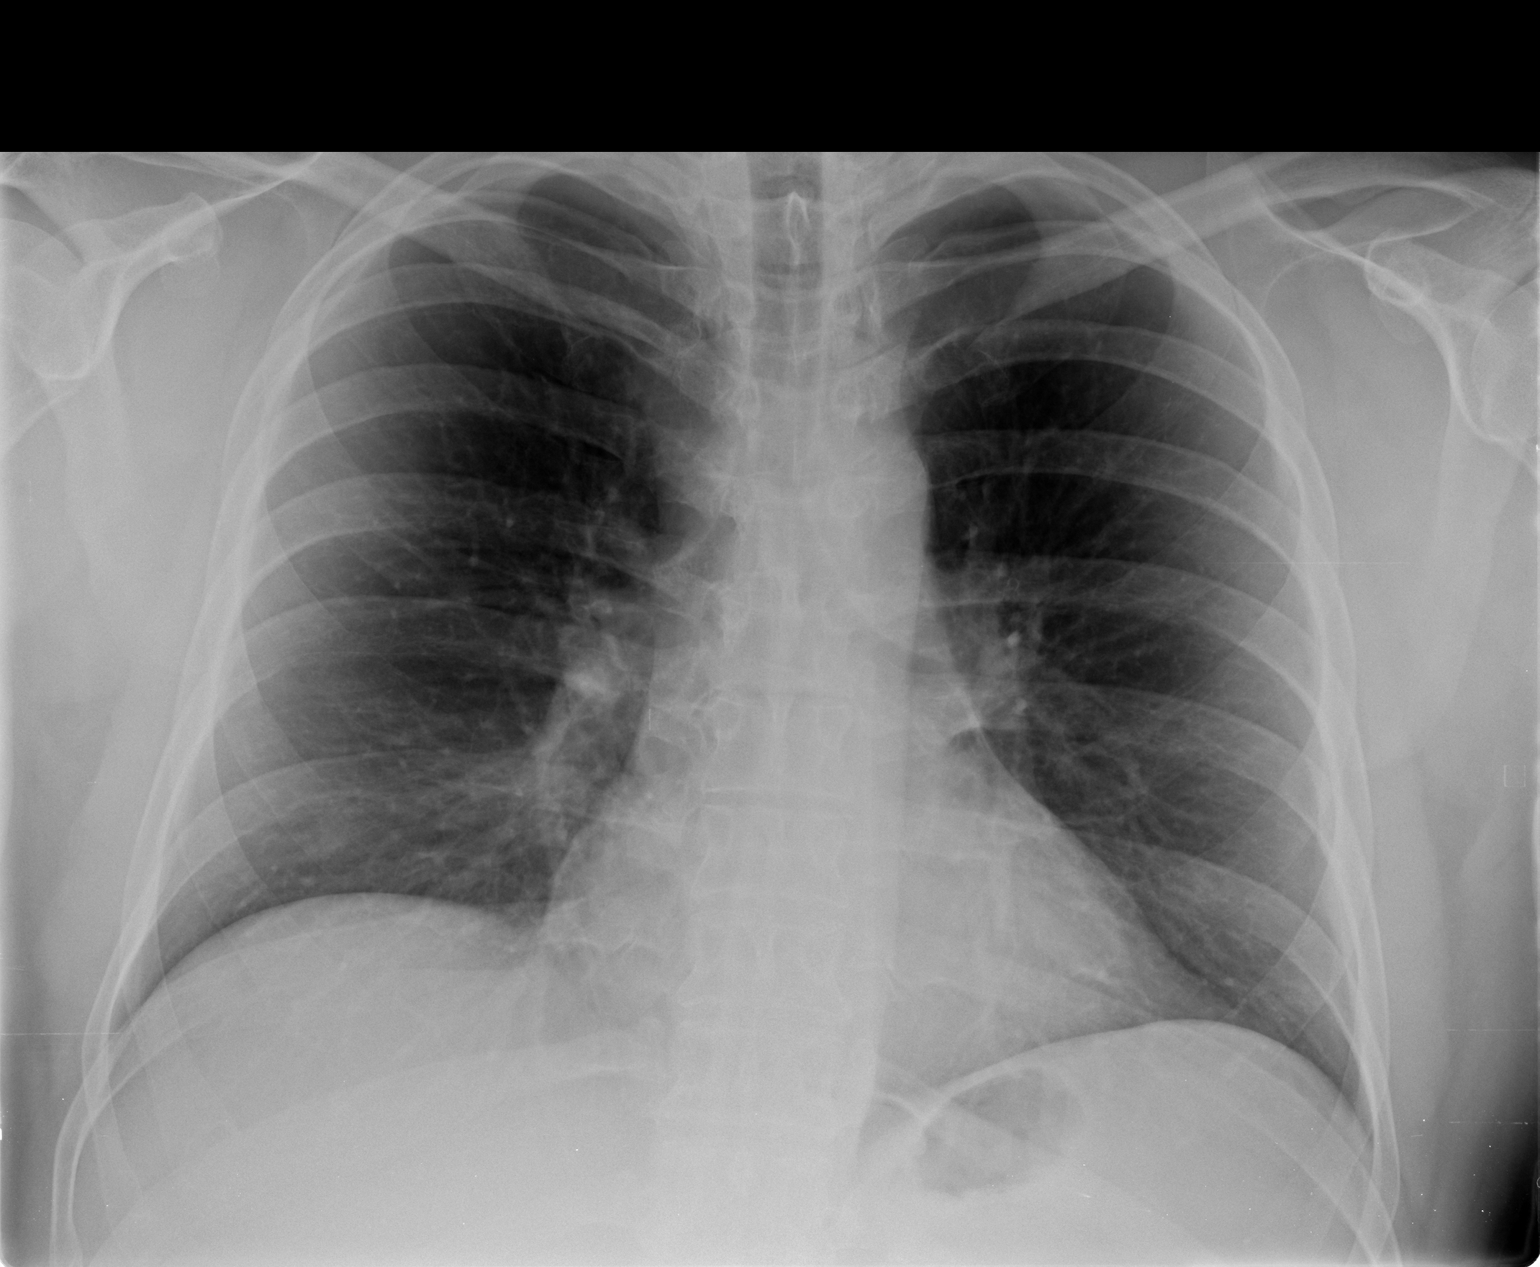

[view not recorded (2 of 2)]
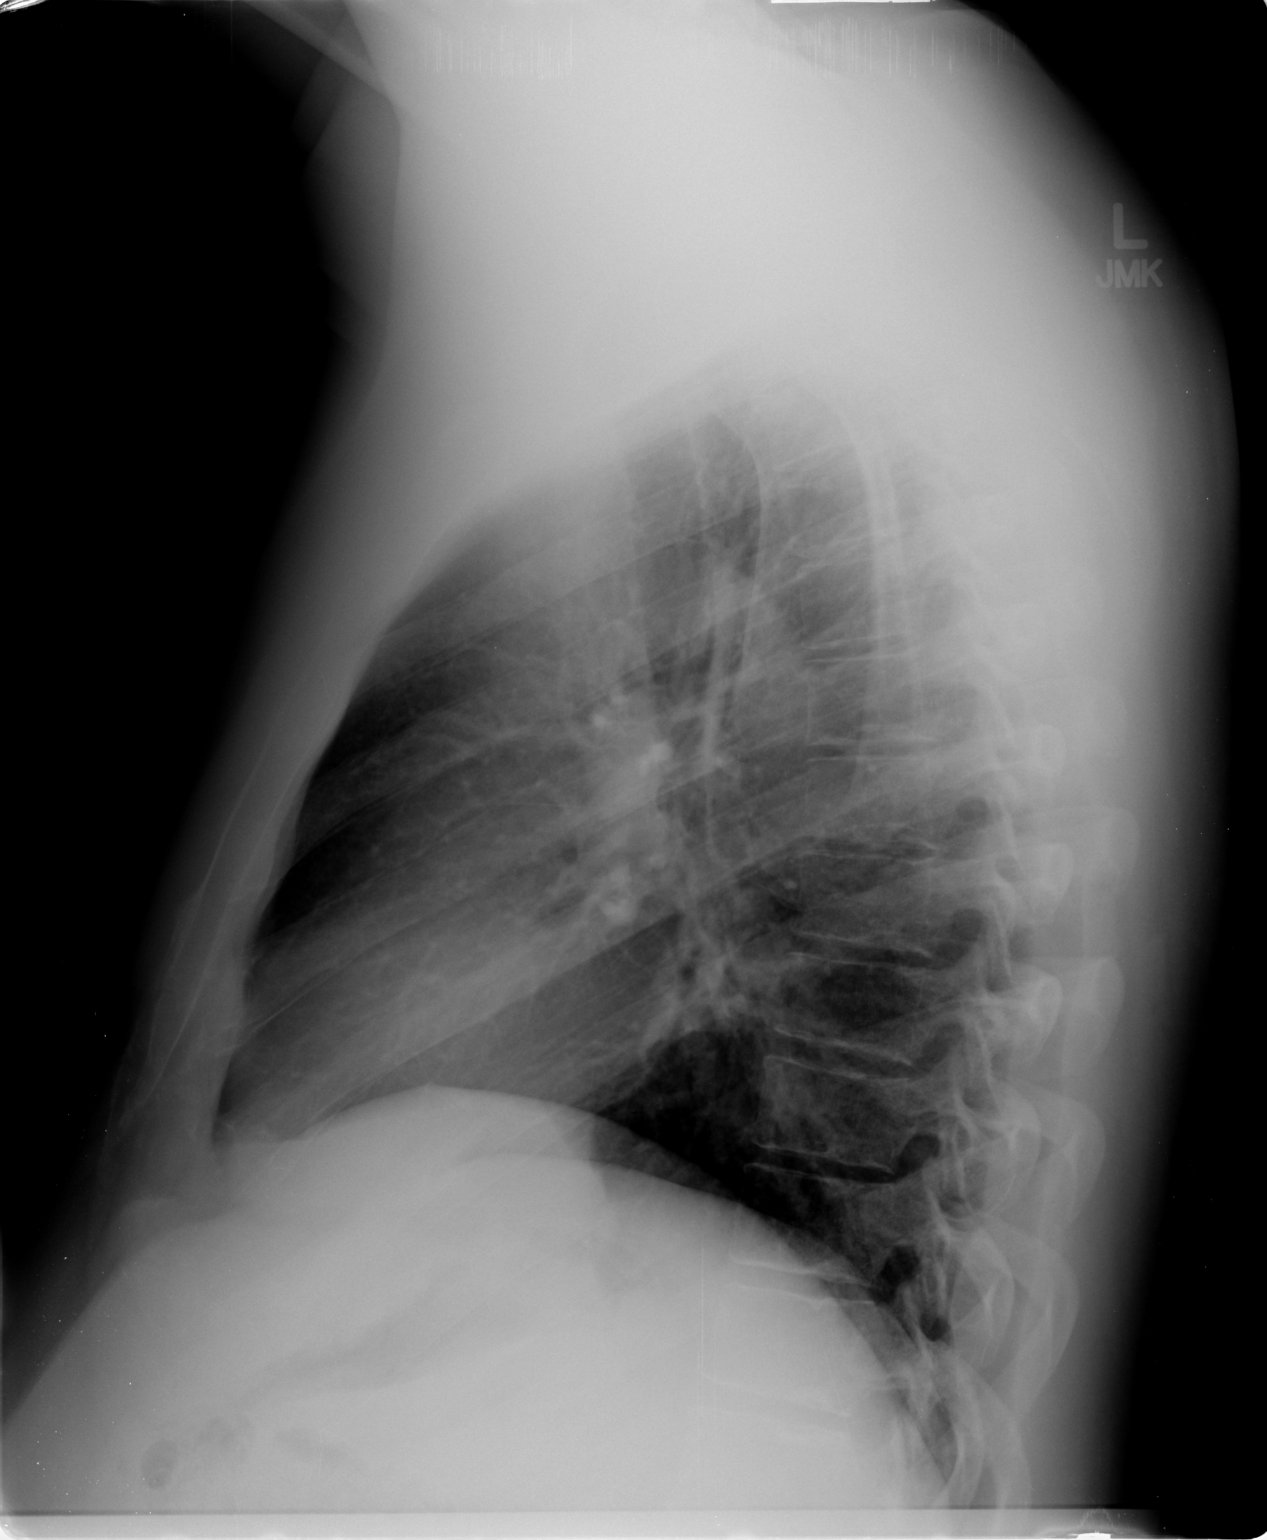

[2 of 2 positions shown; findings below may reference images not displayed]

FINDINGS: Cardiomediastinal silhouette is stable.  No acute
infiltrate or pleural effusion.  No pulmonary edema.  Bony thorax
is unremarkable.
IMPRESSION: No active disease.

## 2014-10-30 IMAGING — DX DG LUMBAR SPINE 2-3V
1 series · 1 of 1 positions shown · non-contrast
Comparison: MR lumbar spine 04/12/2012.

CLINICAL DATA: L4-5 laminectomy and decompression with
microdiscectomy.

EXAM:
LUMBAR SPINE - 2-3 VIEW

[lat]
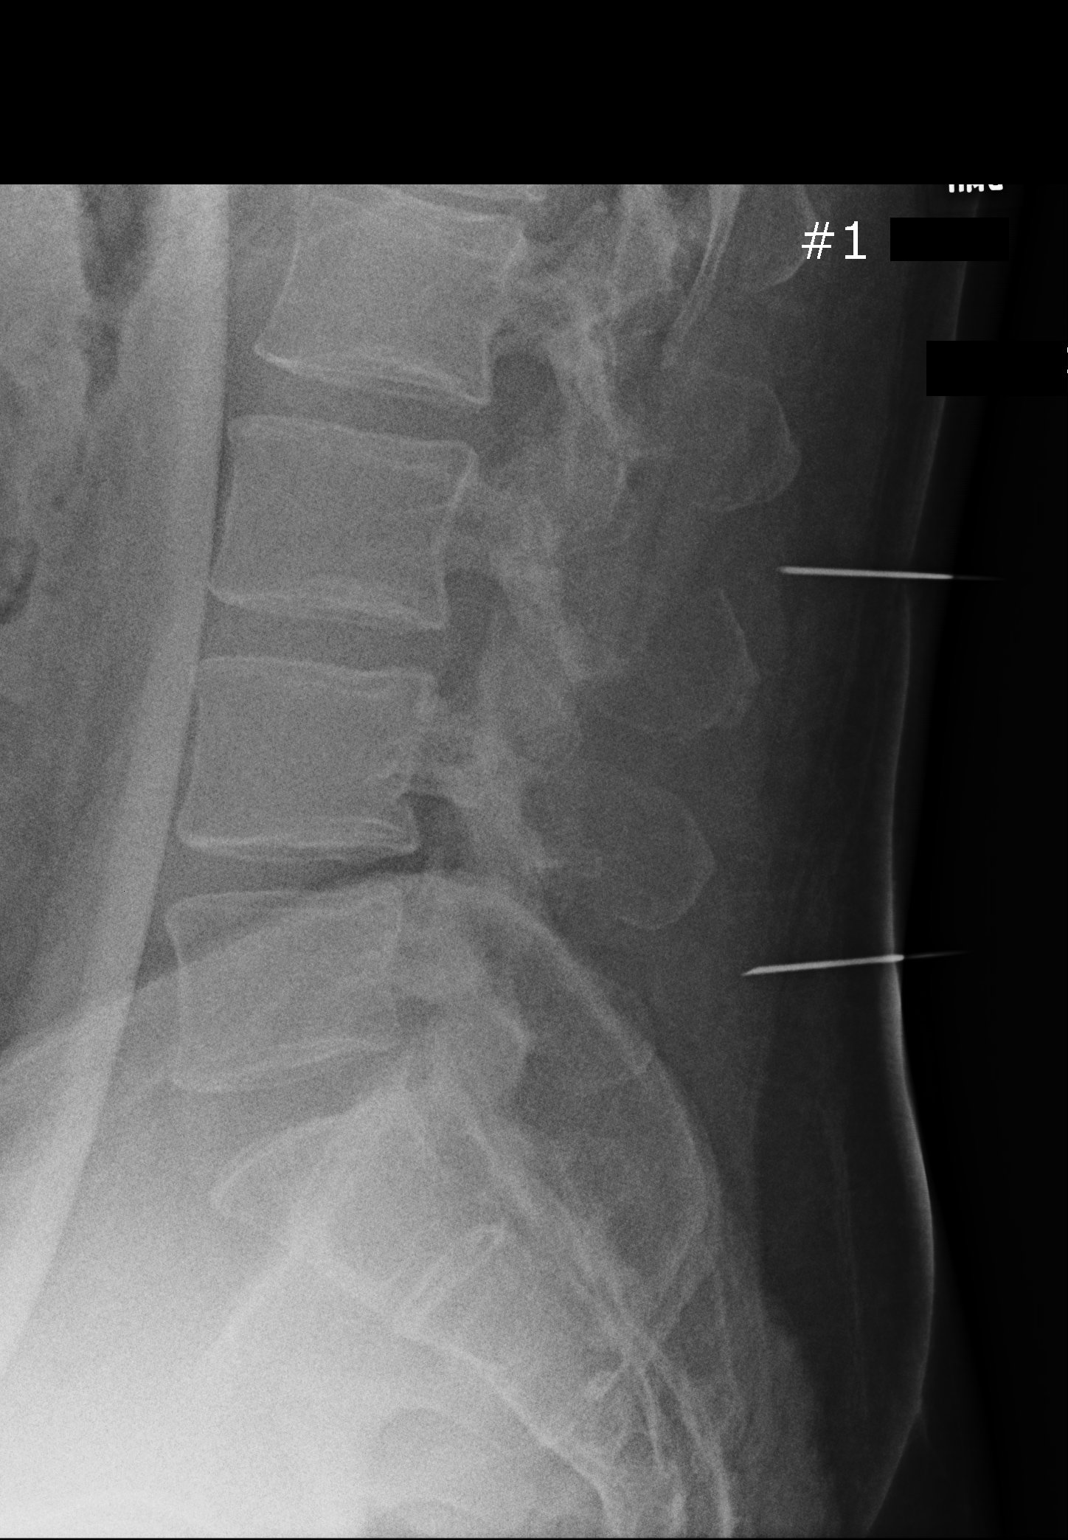

[1 of 1 positions shown; findings below may reference images not displayed]

FINDINGS: Two intraoperative cross table lateral views of the lumbar spine are
submitted. The 1st image, taken at 9411 hr, shows surgical
instrument tips in the soft tissues posterior to the L3 and L5
vertebral bodies.

The 2nd image, taken at 8096 hr, shows a surgical instrument tip
posterior to the L4-5 interspace.
IMPRESSION: Intraoperative localization.

## 2016-01-07 DIAGNOSIS — R740 Nonspecific elevation of levels of transaminase and lactic acid dehydrogenase [LDH]: Secondary | ICD-10-CM

## 2016-01-07 DIAGNOSIS — E119 Type 2 diabetes mellitus without complications: Secondary | ICD-10-CM | POA: Diagnosis not present

## 2016-01-07 DIAGNOSIS — Z789 Other specified health status: Secondary | ICD-10-CM

## 2016-01-07 DIAGNOSIS — Z720 Tobacco use: Secondary | ICD-10-CM | POA: Diagnosis not present

## 2016-01-07 DIAGNOSIS — R079 Chest pain, unspecified: Secondary | ICD-10-CM

## 2016-01-07 DIAGNOSIS — I160 Hypertensive urgency: Secondary | ICD-10-CM

## 2016-01-08 DIAGNOSIS — Z789 Other specified health status: Secondary | ICD-10-CM | POA: Diagnosis not present

## 2016-01-08 DIAGNOSIS — E119 Type 2 diabetes mellitus without complications: Secondary | ICD-10-CM | POA: Diagnosis not present

## 2016-01-08 DIAGNOSIS — Z720 Tobacco use: Secondary | ICD-10-CM | POA: Diagnosis not present

## 2016-01-08 DIAGNOSIS — R740 Nonspecific elevation of levels of transaminase and lactic acid dehydrogenase [LDH]: Secondary | ICD-10-CM | POA: Diagnosis not present

## 2022-01-26 ENCOUNTER — Ambulatory Visit (INDEPENDENT_AMBULATORY_CARE_PROVIDER_SITE_OTHER): Payer: Medicare Other | Admitting: Allergy and Immunology

## 2022-01-26 ENCOUNTER — Encounter: Payer: Self-pay | Admitting: Allergy and Immunology

## 2022-01-26 VITALS — BP 200/118 | HR 79 | Resp 16 | Ht 72.0 in | Wt 248.0 lb

## 2022-01-26 DIAGNOSIS — T7840XD Allergy, unspecified, subsequent encounter: Secondary | ICD-10-CM

## 2022-01-26 DIAGNOSIS — F17210 Nicotine dependence, cigarettes, uncomplicated: Secondary | ICD-10-CM | POA: Diagnosis not present

## 2022-01-26 DIAGNOSIS — T783XXD Angioneurotic edema, subsequent encounter: Secondary | ICD-10-CM | POA: Diagnosis not present

## 2022-01-26 DIAGNOSIS — I10 Essential (primary) hypertension: Secondary | ICD-10-CM

## 2022-01-26 DIAGNOSIS — M25542 Pain in joints of left hand: Secondary | ICD-10-CM | POA: Diagnosis not present

## 2022-01-26 NOTE — Progress Notes (Signed)
[place] - [place] - [place] - [place] - [place]   Dear Dr. Sol Passer,  Thank you for referring Jose Chen to the [facility] [place] on 01/26/2022.   Below is a summation of this patient's evaluation and recommendations.  Thank you for your referral. I will keep you informed about this patient's response to treatment.   If you have any questions please do not hesitate to contact me.   Sincerely,  Jessica Priest, MD Allergy / Immunology [facility]   ______________________________________________________________________    NEW PATIENT NOTE  Referring Provider: Olive Bass, MD Primary Provider: Olive Bass, MD Date of office visit: 01/26/2022    Subjective:   Chief Complaint:  Jose Chen (DOB: 02-17-1975) is a 46 y.o. male who presents to the clinic on 01/26/2022 with a chief complaint of Facial Swelling (X 1 year and becoming more frequent in the last month. ) .     HPI: Jose Chen presents to this clinic in evaluation of recurrent swelling of his face.  He states that in 2023 he has had 3 episodes of swelling with his last episode occurring in October 2023.  He has involvement of lip swelling and chin swelling and cheek swelling without any tongue swelling usually upon awakening in the early hours of the morning with slow resolution over 3 days.  He has been to the emergency room on 1 occasion for this issue and has been to see his primary care doctor on 1 occasion for this issue and it sounds as though he was given prednisone for each of these episodes.  He has no associated systemic or constitutional symptoms when this occurs.  He has not been able to figure out why he has these reactions.  He does eat meat quite commonly and most of his meals involve mammal meat.  He has started new medications at the beginning of 2023 although he has not that well versed on what medications  he is using and at what point in time they were started.  It is quite possible that he was started on an antidiabetic medicine at the beginning of 2023 which may have been metformin or may have been Jardiance.  He has been on his injection for diabetes for the past 6 months or so.  He has been using gabapentin for over a decade.  He has had his blood pressure medicines changed and currently he is on metoprolol and possibly a diuretic.  He has also been having problems with joint pain in the past year.  Specifically, he has left hand pain and left knee pain and left Achilles pain.  There is a numbness in this area and when he moves the joints he gets acute pain.  Past Medical History:  Diagnosis Date   Bronchitis due to tobacco use    Chronic back pain    GERD (gastroesophageal reflux disease)    History of bronchitis    > 81YRS AGO   History of kidney stones    STILL THERE BUT NOT HAVING ANY TROUBLE FROM THIS   Hypertension    TAKES TENORETIC DAILY   Shortness of breath    smoking exacerbates    Past Surgical History:  Procedure Laterality Date   APPENDECTOMY  2009   CYST REMOVED     BUT PT WISHES NOT TO SAY WHERE REMOVED FROM    LUMBAR LAMINECTOMY/DECOMPRESSION MICRODISCECTOMY N/A 11/06/2012   Procedure: LUMBAR  LAMINECTOMY/DECOMPRESSION MICRODISCECTOMY;  Surgeon: Emilee Hero, MD;  Location: [facility] OR;  Service: Orthopedics;  Laterality: N/A;  Lumbar 4-5 decompression    Allergies as of 01/26/2022   No Known Allergies      Medication List    aspirin EC 81 MG tablet Take 1 tablet by mouth daily.   atenolol-chlorthalidone 100-25 MG tablet Commonly known as: TENORETIC Take 1 tablet by mouth daily.   atorvastatin 40 MG tablet Commonly known as: LIPITOR Take 40 mg by mouth daily.   gabapentin 600 MG tablet Commonly known as: NEURONTIN Take by mouth.   hydrALAZINE 10 MG tablet Commonly known as: APRESOLINE Take 1 tablet 10mg  for blood elevated above 160/90    Jardiance 25 MG Tabs tablet Generic drug: empagliflozin Take 25 mg by mouth daily.   meloxicam 7.5 MG tablet Commonly known as: MOBIC Take by mouth.   metFORMIN 1000 MG tablet Commonly known as: GLUCOPHAGE Take 1,000 mg by mouth 2 (two) times daily.   metoprolol succinate 50 MG 24 hr tablet Commonly known as: TOPROL-XL Take 50 mg by mouth daily.   triamterene-hydrochlorothiazide 37.5-25 MG tablet Commonly known as: MAXZIDE-25 Take 1 tablet by mouth daily.   Trulicity 1.5 MG/0.5ML Sopn Generic drug: Dulaglutide Inject into the skin.   valsartan 320 MG tablet Commonly known as: DIOVAN Take 320 mg by mouth daily.    Review of systems negative except as noted in HPI / PMHx or noted below:  Review of Systems  Constitutional: Negative.   HENT: Negative.    Eyes: Negative.   Respiratory: Negative.    Cardiovascular: Negative.   Gastrointestinal: Negative.   Genitourinary: Negative.   Musculoskeletal: Negative.   Skin: Negative.   Neurological: Negative.   Endo/Heme/Allergies: Negative.   Psychiatric/Behavioral: Negative.      Family History  Problem Relation Age of Onset   Asthma Brother    COPD Maternal Grandmother    Emphysema Maternal Grandmother    Allergic rhinitis Daughter    Food Allergy Daughter    Urticaria Neg Hx    Immunodeficiency Neg Hx    Eczema Neg Hx    Angioedema Neg Hx    Atopy Neg Hx     Social History   Socioeconomic History   Marital status: Single    Spouse name: Not on file   Number of children: Not on file   Years of education: Not on file   Highest education level: Not on file  Occupational History   Not on file  Tobacco Use   Smoking status: Every Day    Packs/day: 1.00    Years: 20.00    Total pack years: 20.00    Types: Cigarettes    Passive exposure: Current (wife and daughter smokes)   Smokeless tobacco: Not on file  Vaping Use   Vaping Use: Never used  Substance and Sexual Activity   Alcohol use: Yes     Alcohol/week: 6.0 standard drinks of alcohol    Types: 6 Cans of beer per week    Comment: DAILY BEER(6)   Drug use: No   Sexual activity: Not Currently  Other Topics Concern   Not on file  Social History Narrative   Not on file   Environmental and Social history  Lives in a house with a dry environment, a cat and dog located inside the household, carpet in the bedroom, plastic on the bed, no plastic on the pillow, and actively smoking tobacco with cigarettes at 1-1/2 packs/day for the past 33 years.  Objective:   Vitals:   01/26/22 0902  BP: (!) 200/118  Pulse: 79  Resp: 16  SpO2: 96%   Height: 6' (182.9 cm) Weight: 248 lb (112.5 kg)  Physical Exam Constitutional:      Appearance: He is not diaphoretic.  HENT:     Head: Normocephalic.     Right Ear: Tympanic membrane, ear canal and external ear normal.     Left Ear: Tympanic membrane, ear canal and external ear normal.     Nose: Nose normal. No mucosal edema or rhinorrhea.     Mouth/Throat:     Pharynx: Uvula midline. No oropharyngeal exudate.  Eyes:     Conjunctiva/sclera: Conjunctivae normal.  Neck:     Thyroid: No thyromegaly.     Trachea: Trachea normal. No tracheal tenderness or tracheal deviation.  Cardiovascular:     Rate and Rhythm: Normal rate and regular rhythm.     Heart sounds: Normal heart sounds, S1 normal and S2 normal. No murmur heard. Pulmonary:     Effort: No respiratory distress.     Breath sounds: Normal breath sounds. No stridor. No wheezing or rales.  Lymphadenopathy:     Head:     Right side of head: No tonsillar adenopathy.     Left side of head: No tonsillar adenopathy.     Cervical: No cervical adenopathy.  Skin:    Findings: No erythema or rash.     Nails: There is no clubbing.  Neurological:     Mental Status: He is alert.     Diagnostics: Allergy skin tests were not performed.   Results of blood tests obtained 04 October 2021 identified creatinine 0.91 KU/L, AST 37 U/L, ALT  52 U/L, WBC 7.0, absolute eosinophil 200, absolute lymphocyte 2500, hemoglobin 17.5.  Results of urine analysis obtained 04 October 2021 identified no proteinuria   Assessment and Plan:    1. Angioedema, subsequent encounter   2. Allergic reaction, subsequent encounter   3. Arthralgia of left hand   4. Heavy tobacco smoker >10 cigarettes per day   5. Primary hypertension    1.  Return for skin testing  2.  Blood - alpha gal panel, C1 esterase inhibitor level and function, C4, ANA w/R, CCP, RA, Sed, CRP  3.  Epi-pen, benadryl, MD/ER evaluation for allergic reaction  4.  Metformin???,  Jardiance???,  Dulaglutide???  5.  Use nicotine substitutes to replace smoke exposure  6.  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  It is quite possible he is having an adverse response secondary to through one of his antidiabetic medications.  He is a smoker and had a talk with him today about using nicotine substitutes to replace his smoke exposure and obviously he needs to follow-up with his primary care doctor about his hypertension issue.  I will see him back in this clinic for skin testing.  Jessica Priest, MD Allergy / Immunology [place] [facility] [place]

## 2022-01-26 NOTE — Patient Instructions (Addendum)
  1.  Return for skin testing  2.  Blood - alpha gal panel, C1 esterase inhibitor level and function, C4, ANA w/R, CCP, RA, Sed, CRP  3.  Epi-pen, benadryl, MD/ER evaluation for allergic reaction  4.  Metformin???,  Jardiance???,  Dulaglutide???  5.  Use nicotine substitutes to replace smoke exposure  6.  Visit with primary care doctor about hypertension control

## 2022-01-31 ENCOUNTER — Encounter: Payer: Self-pay | Admitting: Allergy and Immunology

## 2022-02-01 LAB — COMPREHENSIVE METABOLIC PANEL
ALT: 77 IU/L — ABNORMAL HIGH (ref 0–44)
AST: 28 IU/L (ref 0–40)
Albumin/Globulin Ratio: 1.8 (ref 1.2–2.2)
Albumin: 4.6 g/dL (ref 4.1–5.1)
Alkaline Phosphatase: 99 IU/L (ref 44–121)
BUN/Creatinine Ratio: 17 (ref 9–20)
BUN: 16 mg/dL (ref 6–24)
Bilirubin Total: 0.3 mg/dL (ref 0.0–1.2)
CO2: 23 mmol/L (ref 20–29)
Calcium: 10.2 mg/dL (ref 8.7–10.2)
Chloride: 98 mmol/L (ref 96–106)
Creatinine, Ser: 0.94 mg/dL (ref 0.76–1.27)
Globulin, Total: 2.6 g/dL (ref 1.5–4.5)
Glucose: 279 mg/dL — ABNORMAL HIGH (ref 70–99)
Potassium: 4.4 mmol/L (ref 3.5–5.2)
Sodium: 139 mmol/L (ref 134–144)
Total Protein: 7.2 g/dL (ref 6.0–8.5)
eGFR: 101 mL/min/{1.73_m2} (ref >59)

## 2022-02-01 LAB — C1 ESTERASE INHIBITOR, FUNCTIONAL: C1INH Functional/C1INH Total MFr SerPl: >93 %mean normal

## 2022-02-01 LAB — ANA W/REFLEX IF POSITIVE
Anti JO-1: <0.2 AI (ref 0.0–0.9)
Anti Nuclear Antibody (ANA): POSITIVE — AB
Centromere Ab Screen: <0.2 AI (ref 0.0–0.9)
Chromatin Ab SerPl-aCnc: <0.2 AI (ref 0.0–0.9)
ENA RNP Ab: 1 AI — ABNORMAL HIGH (ref 0.0–0.9)
ENA SM Ab Ser-aCnc: <0.2 AI (ref 0.0–0.9)
ENA SSA (RO) Ab: 0.3 AI (ref 0.0–0.9)
ENA SSB (LA) Ab: <0.2 AI (ref 0.0–0.9)
Scleroderma (Scl-70) (ENA) Antibody, IgG: <0.2 AI (ref 0.0–0.9)
dsDNA Ab: 1 IU/mL (ref 0–9)

## 2022-02-01 LAB — RHEUMATOID ARTHRITIS PROFILE
Cyclic Citrullin Peptide Ab: 5 units (ref 0–19)
Rheumatoid fact SerPl-aCnc: <10 IU/mL (ref <14.0)

## 2022-02-01 LAB — ALPHA-GAL PANEL
Allergen Lamb IgE: <0.1 kU/L
Beef IgE: 0.13 kU/L — AB
IgE (Immunoglobulin E), Serum: 814 IU/mL — ABNORMAL HIGH (ref 6–495)
O215-IgE Alpha-Gal: 1.3 kU/L — AB
Pork IgE: <0.1 kU/L

## 2022-02-01 LAB — C4 COMPLEMENT: Complement C4, Serum: 28 mg/dL (ref 12–38)

## 2022-02-01 LAB — SEDIMENTATION RATE: Sed Rate: 11 mm/hr (ref 0–15)

## 2022-02-06 ENCOUNTER — Other Ambulatory Visit: Payer: Self-pay | Admitting: *Deleted

## 2022-02-06 MED ORDER — EPINEPHRINE 0.3 MG/0.3ML IJ SOAJ: INTRAMUSCULAR | 3 refills | Status: AC

## 2023-05-02 DIAGNOSIS — R079 Chest pain, unspecified: Secondary | ICD-10-CM | POA: Diagnosis not present

## 2023-05-03 ENCOUNTER — Telehealth: Payer: Self-pay | Admitting: Emergency Medicine

## 2023-05-03 DIAGNOSIS — I480 Paroxysmal atrial fibrillation: Secondary | ICD-10-CM | POA: Diagnosis not present

## 2023-05-03 DIAGNOSIS — F1010 Alcohol abuse, uncomplicated: Secondary | ICD-10-CM

## 2023-05-03 DIAGNOSIS — Z8249 Family history of ischemic heart disease and other diseases of the circulatory system: Secondary | ICD-10-CM

## 2023-05-03 DIAGNOSIS — R002 Palpitations: Secondary | ICD-10-CM

## 2023-05-03 DIAGNOSIS — I10 Essential (primary) hypertension: Secondary | ICD-10-CM | POA: Diagnosis not present

## 2023-05-03 DIAGNOSIS — R079 Chest pain, unspecified: Secondary | ICD-10-CM | POA: Diagnosis not present

## 2023-05-03 DIAGNOSIS — Z136 Encounter for screening for cardiovascular disorders: Secondary | ICD-10-CM | POA: Insufficient documentation

## 2023-05-03 DIAGNOSIS — E785 Hyperlipidemia, unspecified: Secondary | ICD-10-CM | POA: Diagnosis not present

## 2023-05-03 NOTE — Telephone Encounter (Signed)
-----   Message from Micco R Madireddy sent at 05/03/2023 10:28 AM EDT ----- Also please order an outpatient study for AAA screen ultrasound (dx family hx of mother reportedly aortic rupture related death in her 81s).  Thank you

## 2023-05-07 ENCOUNTER — Ambulatory Visit: Admitting: Emergency Medicine

## 2023-05-07 ENCOUNTER — Ambulatory Visit

## 2023-05-07 DIAGNOSIS — R002 Palpitations: Secondary | ICD-10-CM

## 2023-05-07 MED ORDER — METOPROLOL TARTRATE 100 MG PO TABS
100.0000 mg | ORAL_TABLET | Freq: Once | ORAL | 0 refills | Status: AC
Start: 2023-05-07 — End: 2023-05-07

## 2023-05-07 NOTE — Telephone Encounter (Addendum)
 Your physician has requested that you have an abdominal aorta duplex. During this test, an ultrasound is used to evaluate the aorta. Allow 30 minutes for this exam. Do not eat after midnight the day before and avoid carbonated beverages.  Please note: We ask at that you not bring children with you during ultrasound (echo/ vascular) testing. Due to room size and safety concerns, children are not allowed in the ultrasound rooms during exams. Our front office staff cannot provide observation of children in our lobby area while testing is being conducted. An adult accompanying a patient to their appointment will only be allowed in the ultrasound room at the discretion of the ultrasound technician under special circumstances. We apologize for any inconvenience.    Please follow these instructions carefully (unless otherwise directed):  Hold all erectile dysfunction medications at least 3 days (72 hrs) prior to test.  On the Night Before the Test: Be sure to Drink plenty of water. Do not consume any caffeinated/decaffeinated beverages or chocolate 12 hours prior to your test. Do not take any antihistamines 12 hours prior to your test.   On the Day of the Test:  DO NOT TAKE CARVEDILOL THE MORNING OF YOUR CT.  Drink plenty of water until 1 hour prior to the test. Do not eat any food 4 hours prior to the test. You may take your regular medications prior to the test.  Take metoprolol (Lopressor) 100 MG two hours prior to test.  After the Test: Drink plenty of water. After receiving IV contrast, you may experience a mild flushed feeling. This is normal. On occasion, you may experience a mild rash up to 24 hours after the test. This is not dangerous. If this occurs, you can take Benadryl 25 mg and increase your fluid intake. If you experience trouble breathing, this can be serious. If it is severe call 911 IMMEDIATELY. If it is mild, please call our office. If you take any of these medications:  Glipizide/Metformin, Avandament, Glucavance, please do not take 48 hours after completing test unless otherwise instructed.  We will call to schedule your test 2-4 weeks out understanding that some insurance companies will need an authorization prior to the service being performed.   Other Instructions Cardiac CT Angiogram A cardiac CT angiogram is a procedure to look at the heart and the area around the heart. It may be done to help find the cause of chest pains or other symptoms of heart disease. During this procedure, a substance called contrast dye is injected into the blood vessels in the area to be checked. A large X-ray machine, called a CT scanner, then takes detailed pictures of the heart and the surrounding area. The procedure is also sometimes called a coronary CT angiogram, coronary artery scanning, or CTA. A cardiac CT angiogram allows the health care provider to see how well blood is flowing to and from the heart. The health care provider will be able to see if there are any problems, such as: Blockage or narrowing of the coronary arteries in the heart. Fluid around the heart. Signs of weakness or disease in the muscles, valves, and tissues of the heart. Tell a health care provider about: Any allergies you have. This is especially important if you have had a previous allergic reaction to contrast dye. All medicines you are taking, including vitamins, herbs, eye drops, creams, and over-the-counter medicines. Any blood disorders you have. Any surgeries you have had. Any medical conditions you have. Whether you are pregnant or  may be pregnant. Any anxiety disorders, chronic pain, or other conditions you have that may increase your stress or prevent you from lying still. What are the risks? Generally, this is a safe procedure. However, problems may occur, including: Bleeding. Infection. Allergic reactions to medicines or dyes. Damage to other structures or organs. Kidney damage  from the contrast dye that is used. Increased risk of cancer from radiation exposure. This risk is low. Talk with your health care provider about: The risks and benefits of testing. How you can receive the lowest dose of radiation. What happens before the procedure? Wear comfortable clothing and remove any jewelry, glasses, dentures, and hearing aids. Follow instructions from your health care provider about eating and drinking. This may include: For 12 hours before the procedure -- avoid caffeine. This includes tea, coffee, soda, energy drinks, and diet pills. Drink plenty of water or other fluids that do not have caffeine in them. Being well hydrated can prevent complications. For 4-6 hours before the procedure -- stop eating and drinking. The contrast dye can cause nausea, but this is less likely if your stomach is empty. Ask your health care provider about changing or stopping your regular medicines. This is especially important if you are taking diabetes medicines, blood thinners, or medicines to treat problems with erections (erectile dysfunction). What happens during the procedure?  Hair on your chest may need to be removed so that small sticky patches called electrodes can be placed on your chest. These will transmit information that helps to monitor your heart during the procedure. An IV will be inserted into one of your veins. You might be given a medicine to control your heart rate during the procedure. This will help to ensure that good images are obtained. You will be asked to lie on an exam table. This table will slide in and out of the CT machine during the procedure. Contrast dye will be injected into the IV. You might feel warm, or you may get a metallic taste in your mouth. You will be given a medicine called nitroglycerin. This will relax or dilate the arteries in your heart. The table that you are lying on will move into the CT machine tunnel for the scan. The person running the  machine will give you instructions while the scans are being done. You may be asked to: Keep your arms above your head. Hold your breath. Stay very still, even if the table is moving. When the scanning is complete, you will be moved out of the machine. The IV will be removed. The procedure may vary among health care providers and hospitals. What can I expect after the procedure? After your procedure, it is common to have: A metallic taste in your mouth from the contrast dye. A feeling of warmth. A headache from the nitroglycerin. Follow these instructions at home: Take over-the-counter and prescription medicines only as told by your health care provider. If you are told, drink enough fluid to keep your urine pale yellow. This will help to flush the contrast dye out of your body. Most people can return to their normal activities right after the procedure. Ask your health care provider what activities are safe for you. It is up to you to get the results of your procedure. Ask your health care provider, or the department that is doing the procedure, when your results will be ready. Keep all follow-up visits as told by your health care provider. This is important. Contact a health care provider if:  You have any symptoms of allergy to the contrast dye. These include: Shortness of breath. Rash or hives. A racing heartbeat. Summary A cardiac CT angiogram is a procedure to look at the heart and the area around the heart. It may be done to help find the cause of chest pains or other symptoms of heart disease. During this procedure, a large X-ray machine, called a CT scanner, takes detailed pictures of the heart and the surrounding area after a contrast dye has been injected into blood vessels in the area. Ask your health care provider about changing or stopping your regular medicines before the procedure. This is especially important if you are taking diabetes medicines, blood thinners, or medicines  to treat erectile dysfunction. If you are told, drink enough fluid to keep your urine pale yellow. This will help to flush the contrast dye out of your body. This information is not intended to replace advice given to you by your health care provider. Make sure you discuss any questions you have with your health care provider. Document Revised: 09/11/2018 Document Reviewed: 09/11/2018 Elsevier Patient Education  2020 [employer].

## 2023-05-07 NOTE — Addendum Note (Signed)
 Addended by: Lonia Farber on: 05/07/2023 12:55 PM   Modules accepted: Orders

## 2023-05-07 NOTE — Telephone Encounter (Signed)
 Called and spoke to patient. Reviewed testing that Dr. Vincent Gros requested patient have. Patient coming in today for monitor visit. Instructions will be giving to patient at that time.

## 2023-05-07 NOTE — Addendum Note (Signed)
 Addended by: Lonia Farber on: 05/07/2023 01:53 PM   Modules accepted: Orders

## 2023-05-07 NOTE — Progress Notes (Signed)
   Nurse Visit   Date of Encounter: 05/07/2023 ID: Jose Chen, DOB 09-Aug-1975, MRN [id number]  PCP:  Olive Bass, MD   [facility] Providers Cardiologist:  None      Visit Details   VS:  There were no vitals taken for this visit. , BMI There is no height or weight on file to calculate BMI.  Wt Readings from Last 3 Encounters:  01/26/22 248 lb (112.5 kg)  11/01/12 251 lb 3.2 oz (113.9 kg)  06/13/12 245 lb 9.6 oz (111.4 kg)     Reason for visit: vital signs Performed today: vital signs for cardiac ct  Changes (medications, testing, etc.) : none Length of Visit: 10 minutes    Medications Adjustments/Labs and Tests Ordered: No orders of the defined types were placed in this encounter.  No orders of the defined types were placed in this encounter.    Signed, Lonia Farber, RN  05/07/2023 1:54 PM

## 2023-06-04 ENCOUNTER — Ambulatory Visit

## 2023-06-18 ENCOUNTER — Telehealth (HOSPITAL_COMMUNITY): Payer: Self-pay | Admitting: Emergency Medicine

## 2023-06-18 NOTE — Telephone Encounter (Signed)
 Attempted to call patient regarding upcoming cardiac CT appointment. Left message on voicemail with name and callback number Rockwell Alexandria RN Navigator Cardiac Imaging [facility] [phone] Office [phone] Cell

## 2023-06-19 ENCOUNTER — Ambulatory Visit (HOSPITAL_BASED_OUTPATIENT_CLINIC_OR_DEPARTMENT_OTHER): Admission: RE | Admit: 2023-06-19 | Source: Ambulatory Visit

## 2023-07-06 ENCOUNTER — Ambulatory Visit (HOSPITAL_COMMUNITY)

## 2023-07-16 ENCOUNTER — Telehealth (HOSPITAL_COMMUNITY): Payer: Self-pay | Admitting: *Deleted

## 2023-07-16 NOTE — Telephone Encounter (Signed)
 Attempted to call patient regarding upcoming cardiac CT appointment. Left message on voicemail with name and callback number Johney Frame RN Navigator Cardiac Imaging [facility] [phone] Office

## 2023-07-17 ENCOUNTER — Ambulatory Visit (HOSPITAL_BASED_OUTPATIENT_CLINIC_OR_DEPARTMENT_OTHER)

## 2023-07-27 ENCOUNTER — Ambulatory Visit (HOSPITAL_COMMUNITY): Admission: RE | Admit: 2023-07-27 | Source: Ambulatory Visit

## 2023-09-04 ENCOUNTER — Ambulatory Visit: Payer: Self-pay

## 2023-09-04 DIAGNOSIS — R002 Palpitations: Secondary | ICD-10-CM | POA: Diagnosis not present

## 2023-09-05 NOTE — Progress Notes (Signed)
 Left message for the patient to call back.

## 2023-09-07 NOTE — Progress Notes (Signed)
 Left message for the patient to call back.
